# Patient Record
Sex: Female | Born: 1965 | Hispanic: Yes | Marital: Married | Smoking: Never smoker
Health system: Southern US, Community
[De-identification: ages and names within clinical notes are randomized; demographics above are authoritative.]

---

## 2007-12-11 HISTORY — PX: AUGMENTATION MAMMAPLASTY: SUR837

## 2007-12-11 HISTORY — PX: REDUCTION MAMMAPLASTY: SUR839

## 2012-04-02 ENCOUNTER — Ambulatory Visit: Payer: Self-pay | Admitting: Internal Medicine

## 2012-08-20 ENCOUNTER — Ambulatory Visit: Payer: Self-pay | Admitting: Family Medicine

## 2014-11-10 ENCOUNTER — Encounter: Payer: Self-pay | Admitting: Podiatry

## 2014-11-10 ENCOUNTER — Ambulatory Visit (INDEPENDENT_AMBULATORY_CARE_PROVIDER_SITE_OTHER): Payer: 59

## 2014-11-10 ENCOUNTER — Ambulatory Visit (INDEPENDENT_AMBULATORY_CARE_PROVIDER_SITE_OTHER): Payer: 59 | Admitting: Podiatry

## 2014-11-10 VITALS — BP 129/82 | HR 121 | Resp 16 | Ht 65.0 in | Wt 140.0 lb

## 2014-11-10 DIAGNOSIS — M722 Plantar fascial fibromatosis: Secondary | ICD-10-CM | POA: Diagnosis not present

## 2014-11-10 MED ORDER — MELOXICAM 15 MG PO TABS: 15.0000 mg | ORAL_TABLET | Freq: Every day | ORAL | Status: DC

## 2014-11-10 MED ORDER — METHYLPREDNISOLONE (PAK) 4 MG PO TABS: ORAL_TABLET | ORAL | Status: AC

## 2014-11-10 NOTE — Patient Instructions (Signed)
Plantar Fasciitis (Heel Spur Syndrome) with Rehab The plantar fascia is a fibrous, ligament-like, soft-tissue structure that spans the bottom of the foot. Plantar fasciitis is a condition that causes pain in the foot due to inflammation of the tissue. SYMPTOMS   Pain and tenderness on the underneath side of the foot.  Pain that worsens with standing or walking. CAUSES  Plantar fasciitis is caused by irritation and injury to the plantar fascia on the underneath side of the foot. Common mechanisms of injury include:  Direct trauma to bottom of the foot.  Damage to a small nerve that runs under the foot where the main fascia attaches to the heel bone.  Stress placed on the plantar fascia due to bone spurs. RISK INCREASES WITH:   Activities that place stress on the plantar fascia (running, jumping, pivoting, or cutting).  Poor strength and flexibility.  Improperly fitted shoes.  Tight calf muscles.  Flat feet.  Failure to warm-up properly before activity.  Obesity. PREVENTION  Warm up and stretch properly before activity.  Allow for adequate recovery between workouts.  Maintain physical fitness:  Strength, flexibility, and endurance.  Cardiovascular fitness.  Maintain a health body weight.  Avoid stress on the plantar fascia.  Wear properly fitted shoes, including arch supports for individuals who have flat feet. PROGNOSIS  If treated properly, then the symptoms of plantar fasciitis usually resolve without surgery. However, occasionally surgery is necessary. RELATED COMPLICATIONS   Recurrent symptoms that may result in a chronic condition.  Problems of the lower back that are caused by compensating for the injury, such as limping.  Pain or weakness of the foot during push-off following surgery.  Chronic inflammation, scarring, and partial or complete fascia tear, occurring more often from repeated injections. TREATMENT  Treatment initially involves the use of  ice and medication to help reduce pain and inflammation. The use of strengthening and stretching exercises may help reduce pain with activity, especially stretches of the Achilles tendon. These exercises may be performed at home or with a therapist. Your caregiver may recommend that you use heel cups of arch supports to help reduce stress on the plantar fascia. Occasionally, corticosteroid injections are given to reduce inflammation. If symptoms persist for greater than 6 months despite non-surgical (conservative), then surgery may be recommended.  MEDICATION   If pain medication is necessary, then nonsteroidal anti-inflammatory medications, such as aspirin and ibuprofen, or other minor pain relievers, such as acetaminophen, are often recommended.  Do not take pain medication within 7 days before surgery.  Prescription pain relievers may be given if deemed necessary by your caregiver. Use only as directed and only as much as you need.  Corticosteroid injections may be given by your caregiver. These injections should be reserved for the most serious cases, because they may only be given a certain number of times. HEAT AND COLD  Cold treatment (icing) relieves pain and reduces inflammation. Cold treatment should be applied for 10 to 15 minutes every 2 to 3 hours for inflammation and pain and immediately after any activity that aggravates your symptoms. Use ice packs or massage the area with a piece of ice (ice massage).  Heat treatment may be used prior to performing the stretching and strengthening activities prescribed by your caregiver, physical therapist, or athletic trainer. Use a heat pack or soak the injury in warm water. SEEK IMMEDIATE MEDICAL CARE IF:  Treatment seems to offer no benefit, or the condition worsens.  Any medications produce adverse side effects. EXERCISES RANGE   OF MOTION (ROM) AND STRETCHING EXERCISES - Plantar Fasciitis (Heel Spur Syndrome) These exercises may help you  when beginning to rehabilitate your injury. Your symptoms may resolve with or without further involvement from your physician, physical therapist or athletic trainer. While completing these exercises, remember:   Restoring tissue flexibility helps normal motion to return to the joints. This allows healthier, less painful movement and activity.  An effective stretch should be held for at least 30 seconds.  A stretch should never be painful. You should only feel a gentle lengthening or release in the stretched tissue. RANGE OF MOTION - Toe Extension, Flexion  Sit with your right / left leg crossed over your opposite knee.  Grasp your toes and gently pull them back toward the top of your foot. You should feel a stretch on the bottom of your toes and/or foot.  Hold this stretch for __________ seconds.  Now, gently pull your toes toward the bottom of your foot. You should feel a stretch on the top of your toes and or foot.  Hold this stretch for __________ seconds. Repeat __________ times. Complete this stretch __________ times per day.  RANGE OF MOTION - Ankle Dorsiflexion, Active Assisted  Remove shoes and sit on a chair that is preferably not on a carpeted surface.  Place right / left foot under knee. Extend your opposite leg for support.  Keeping your heel down, slide your right / left foot back toward the chair until you feel a stretch at your ankle or calf. If you do not feel a stretch, slide your bottom forward to the edge of the chair, while still keeping your heel down.  Hold this stretch for __________ seconds. Repeat __________ times. Complete this stretch __________ times per day.  STRETCH - Gastroc, Standing  Place hands on wall.  Extend right / left leg, keeping the front knee somewhat bent.  Slightly point your toes inward on your back foot.  Keeping your right / left heel on the floor and your knee straight, shift your weight toward the wall, not allowing your back to  arch.  You should feel a gentle stretch in the right / left calf. Hold this position for __________ seconds. Repeat __________ times. Complete this stretch __________ times per day. STRETCH - Soleus, Standing  Place hands on wall.  Extend right / left leg, keeping the other knee somewhat bent.  Slightly point your toes inward on your back foot.  Keep your right / left heel on the floor, bend your back knee, and slightly shift your weight over the back leg so that you feel a gentle stretch deep in your back calf.  Hold this position for __________ seconds. Repeat __________ times. Complete this stretch __________ times per day. STRETCH - Gastrocsoleus, Standing  Note: This exercise can place a lot of stress on your foot and ankle. Please complete this exercise only if specifically instructed by your caregiver.   Place the ball of your right / left foot on a step, keeping your other foot firmly on the same step.  Hold on to the wall or a rail for balance.  Slowly lift your other foot, allowing your body weight to press your heel down over the edge of the step.  You should feel a stretch in your right / left calf.  Hold this position for __________ seconds.  Repeat this exercise with a slight bend in your right / left knee. Repeat __________ times. Complete this stretch __________ times per day.    STRENGTHENING EXERCISES - Plantar Fasciitis (Heel Spur Syndrome)  These exercises may help you when beginning to rehabilitate your injury. They may resolve your symptoms with or without further involvement from your physician, physical therapist or athletic trainer. While completing these exercises, remember:   Muscles can gain both the endurance and the strength needed for everyday activities through controlled exercises.  Complete these exercises as instructed by your physician, physical therapist or athletic trainer. Progress the resistance and repetitions only as guided. STRENGTH -  Towel Curls  Sit in a chair positioned on a non-carpeted surface.  Place your foot on a towel, keeping your heel on the floor.  Pull the towel toward your heel by only curling your toes. Keep your heel on the floor.  If instructed by your physician, physical therapist or athletic trainer, add ____________________ at the end of the towel. Repeat __________ times. Complete this exercise __________ times per day. STRENGTH - Ankle Inversion  Secure one end of a rubber exercise band/tubing to a fixed object (table, pole). Loop the other end around your foot just before your toes.  Place your fists between your knees. This will focus your strengthening at your ankle.  Slowly, pull your big toe up and in, making sure the band/tubing is positioned to resist the entire motion.  Hold this position for __________ seconds.  Have your muscles resist the band/tubing as it slowly pulls your foot back to the starting position. Repeat __________ times. Complete this exercises __________ times per day.  Document Released: 11/26/2005 Document Revised: 02/18/2012 Document Reviewed: 03/10/2009 Exit[employer] Patient Information 2015 [employer]. This information is not intended to replace advice given to you by your health care provider. Make sure you discuss any questions you have with your health care provider. Plantar Fasciitis Plantar fasciitis is a common condition that causes foot pain. It is soreness (inflammation) of the band of tough fibrous tissue on the bottom of the foot that runs from the heel bone (calcaneus) to the ball of the foot. The cause of this soreness may be from excessive standing, poor fitting shoes, running on hard surfaces, being overweight, having an abnormal walk, or overuse (this is common in runners) of the painful foot or feet. It is also common in aerobic exercise dancers and ballet dancers. SYMPTOMS  Most people with plantar fasciitis complain of:  Severe pain in the morning  on the bottom of their foot especially when taking the first steps out of bed. This pain recedes after a few minutes of walking.  Severe pain is experienced also during walking following a long period of inactivity.  Pain is worse when walking barefoot or up stairs DIAGNOSIS   Your caregiver will diagnose this condition by examining and feeling your foot.  Special tests such as X-rays of your foot, are usually not needed. PREVENTION   Consult a sports medicine professional before beginning a new exercise program.  Walking programs offer a good workout. With walking there is a lower chance of overuse injuries common to runners. There is less impact and less jarring of the joints.  Begin all new exercise programs slowly. If problems or pain develop, decrease the amount of time or distance until you are at a comfortable level.  Wear good shoes and replace them regularly.  Stretch your foot and the heel cords at the back of the ankle (Achilles tendon) both before and after exercise.  Run or exercise on even surfaces that are not hard. For example, asphalt is better than   pavement.  Do not run barefoot on hard surfaces.  If using a treadmill, vary the incline.  Do not continue to workout if you have foot or joint problems. Seek professional help if they do not improve. HOME CARE INSTRUCTIONS   Avoid activities that cause you pain until you recover.  Use ice or cold packs on the problem or painful areas after working out.  Only take over-the-counter or prescription medicines for pain, discomfort, or fever as directed by your caregiver.  Soft shoe inserts or athletic shoes with air or gel sole cushions may be helpful.  If problems continue or become more severe, consult a sports medicine caregiver or your own health care provider. Cortisone is a potent anti-inflammatory medication that may be injected into the painful area. You can discuss this treatment with your caregiver. MAKE SURE  YOU:   Understand these instructions.  Will watch your condition.  Will get help right away if you are not doing well or get worse. Document Released: 08/21/2001 Document Revised: 02/18/2012 Document Reviewed: 10/20/2008 ExitCare Patient Information 2015 [employer]. This information is not intended to replace advice given to you by your health care provider. Make sure you discuss any questions you have with your health care provider.  

## 2014-11-10 NOTE — Progress Notes (Signed)
   Subjective:    Patient ID: Diana Mathis, female    DOB: 01/07/1966, 48 y.o.   MRN: [id number]  HPI Comments: i have pain in my left heel. Its been going on for 1 month. The more i walk the less it hurts. The pain is worse in the mornings and at night. i dont do anything for my heel.  Foot Pain      Review of Systems  All other systems reviewed and are negative.      Objective:   Physical Exam: I have reviewed her past medical history medications allergies surgery social history and review of systems. Pulses are strongly palpable bilateral. Neurologic sensorium is intact per Semmes-Weinstein monofilament. Degenerative flexors are intact bilateral muscle strength is 5 over 5 dorsiflexion plantar flexors and inverters and everters all intrinsic musculature is intact. Orthopedic evaluation demonstrates all joints distal to the ankle level full range of motion without crepitation. There she does have pain on palpation medial calcaneal tubercle of the left heel. Radiographic evaluation does not demonstrate any type of osseus abnormalities other than a soft tissue increase in density at the plantar fascial calcaneal insertion site. Cutaneous evaluation demonstrates supple well-hydrated cutis no erythema edema saline as drainage or odor.        Assessment & Plan:  Assessment: Plantar fasciitis left.  Plan: Discussed etiology pathology conservative versus surgical therapies. Discussed appropriate shoe gear stretching exercises ice therapy shoe gear modifications. Wrote a prescription for a Medrol Dosepak to be dispensed as well as meloxicam which will follow Medrol. She also received an injection of Kenalog and local anesthetic to her left heel. A plantar fascial brace was dispensed as well as a night splint. I will follow up with her in 1 month. Should questions arise or she had difficulty with this foot she is to notify us immediately.

## 2014-12-08 ENCOUNTER — Ambulatory Visit: Payer: PRIVATE HEALTH INSURANCE | Admitting: Podiatry

## 2014-12-08 ENCOUNTER — Ambulatory Visit (INDEPENDENT_AMBULATORY_CARE_PROVIDER_SITE_OTHER): Payer: 59 | Admitting: Podiatry

## 2014-12-08 VITALS — BP 132/95 | HR 112 | Resp 16

## 2014-12-08 DIAGNOSIS — M722 Plantar fascial fibromatosis: Secondary | ICD-10-CM

## 2014-12-08 NOTE — Progress Notes (Signed)
She presents today for follow-up of her plantar fasciitis states that she is not approximately 90% improved. She continues all conservative therapies.  Objective: Vital signs are stable she is alert and oriented 3 she has no pain on palpation medially continue tubercle of her left heel.  Assessment: Resolving plantar fasciitis left heel.  Plan: Continue all conservative therapies until 1 month point we free of pain and then discontinue all therapy. Follow up with me as needed at which time orthotics will be discussed.

## 2015-01-05 ENCOUNTER — Ambulatory Visit (INDEPENDENT_AMBULATORY_CARE_PROVIDER_SITE_OTHER): Payer: PRIVATE HEALTH INSURANCE | Admitting: Podiatry

## 2015-01-05 VITALS — BP 120/80 | HR 100 | Resp 16

## 2015-01-05 DIAGNOSIS — M722 Plantar fascial fibromatosis: Secondary | ICD-10-CM

## 2015-01-05 DIAGNOSIS — G5792 Unspecified mononeuropathy of left lower limb: Secondary | ICD-10-CM

## 2015-01-05 NOTE — Progress Notes (Signed)
She presents today for follow-up of her plantar fasciitis which she states is 85% improved however she does have some radiating pain from the dorsal aspect of her left foot. She states that the pain runs between her hallux and her second toe primarily at nighttime.  Objective: Vital signs are stable she's alert and oriented 3. There is no erythema or edema saline strength she has no pain on palpation medial tibial tubercle. The replication of pain to the hallux. She does have a dorsal tarsal exostosis the second metatarsal intermediate cuneiform joint associated with deep peroneal nerve entrapment possibly.  Assessment: Neuritis deep peroneal nerve left. Possibly associated with exostosi Also resolving plantar fasciitis.  Plan: Continue conservative therapies and loosen the the night splint dorsal strap. Continue all conservative therapies follow-up with me with recurrence

## 2015-01-28 ENCOUNTER — Ambulatory Visit: Payer: Self-pay | Admitting: Family Medicine

## 2015-06-22 ENCOUNTER — Other Ambulatory Visit: Payer: Self-pay | Admitting: Podiatry

## 2016-02-29 ENCOUNTER — Other Ambulatory Visit: Payer: Self-pay | Admitting: Family Medicine

## 2016-02-29 DIAGNOSIS — Z1231 Encounter for screening mammogram for malignant neoplasm of breast: Secondary | ICD-10-CM

## 2016-04-27 ENCOUNTER — Other Ambulatory Visit: Payer: Self-pay | Admitting: Family Medicine

## 2016-04-27 ENCOUNTER — Ambulatory Visit
Admission: RE | Admit: 2016-04-27 | Discharge: 2016-04-27 | Disposition: A | Discharge status: Home / Self Care | Payer: 59 | Source: Ambulatory Visit | Attending: Family Medicine | Admitting: Family Medicine

## 2016-04-27 DIAGNOSIS — Z1231 Encounter for screening mammogram for malignant neoplasm of breast: Secondary | ICD-10-CM | POA: Insufficient documentation

## 2017-03-14 ENCOUNTER — Other Ambulatory Visit: Payer: Self-pay | Admitting: Family Medicine

## 2017-03-14 DIAGNOSIS — Z1231 Encounter for screening mammogram for malignant neoplasm of breast: Secondary | ICD-10-CM

## 2017-05-03 ENCOUNTER — Ambulatory Visit
Admission: RE | Admit: 2017-05-03 | Discharge: 2017-05-03 | Disposition: A | Discharge status: Home / Self Care | Payer: 59 | Source: Ambulatory Visit | Attending: Family Medicine | Admitting: Family Medicine

## 2017-05-03 ENCOUNTER — Encounter: Payer: Self-pay | Admitting: Radiology

## 2017-05-03 DIAGNOSIS — Z1231 Encounter for screening mammogram for malignant neoplasm of breast: Secondary | ICD-10-CM

## 2018-05-23 ENCOUNTER — Ambulatory Visit
Admission: EM | Admit: 2018-05-23 | Discharge: 2018-05-23 | Disposition: A | Discharge status: Home / Self Care | Payer: 59 | Attending: Family Medicine | Admitting: Family Medicine

## 2018-05-23 ENCOUNTER — Encounter: Payer: Self-pay | Admitting: Gynecology

## 2018-05-23 ENCOUNTER — Other Ambulatory Visit: Payer: Self-pay

## 2018-05-23 DIAGNOSIS — B9789 Other viral agents as the cause of diseases classified elsewhere: Secondary | ICD-10-CM

## 2018-05-23 DIAGNOSIS — J069 Acute upper respiratory infection, unspecified: Secondary | ICD-10-CM | POA: Diagnosis not present

## 2018-05-23 DIAGNOSIS — R05 Cough: Secondary | ICD-10-CM | POA: Diagnosis not present

## 2018-05-23 MED ORDER — HYDROCOD POLST-CPM POLST ER 10-8 MG/5ML PO SUER: 5.0000 mL | Freq: Every evening | ORAL | 0 refills | Status: AC | PRN

## 2018-05-23 NOTE — ED Triage Notes (Signed)
Patient c/o cough x 1 week.

## 2018-05-23 NOTE — Discharge Instructions (Signed)
Flonase nasal spray Continue Allegra

## 2018-05-23 NOTE — ED Provider Notes (Signed)
[facility]    CSN: [id number] Arrival date & time: 05/23/18  1238     History   Chief Complaint Chief Complaint  Patient presents with  . Cough    HPI Diana Mathis is a 52 y.o. female.   The history is provided by the patient.  Cough  Associated symptoms: rhinorrhea   Associated symptoms: no wheezing   URI  Presenting symptoms: congestion, cough and rhinorrhea   Severity:  Moderate Onset quality:  Sudden Duration:  1 week Timing:  Constant Progression:  Unchanged Chronicity:  New Relieved by:  Nothing Ineffective treatments:  OTC medications Associated symptoms: no sinus pain, no swollen glands and no wheezing   Risk factors: not elderly, no chronic cardiac disease, no chronic kidney disease, no chronic respiratory disease, no diabetes mellitus, no immunosuppression, no recent illness, no recent travel and no sick contacts     History reviewed. No pertinent past medical history.  There are no active problems to display for this patient.   Past Surgical History:  Procedure Laterality Date  . AUGMENTATION MAMMAPLASTY Bilateral 2009  . REDUCTION MAMMAPLASTY Bilateral 2009    OB History   None      Home Medications    Prior to Admission medications   Medication Sig Start Date End Date Taking? Authorizing Provider  ibuprofen (ADVIL,MOTRIN) 200 MG tablet Take 200 mg by mouth as needed.   Yes [provider]  chlorpheniramine-HYDROcodone (TUSSIONEX PENNKINETIC ER) 10-8 MG/5ML SUER Take 5 mLs by mouth at bedtime as needed. 05/23/18   Payton Mccallum, MD  meloxicam (MOBIC) 15 MG tablet TAKE 1 TABLET BY MOUTH EVERY DAY 06/22/15   Hyatt, Max T, DPM  methylPREDNIsolone (MEDROL DOSPACK) 4 MG tablet follow package directions Patient not taking: Reported on 12/08/2014 11/10/14   Elinor Parkinson, DPM    Family History Family History  Problem Relation Age of Onset  . Breast cancer Maternal Aunt   . Breast cancer Maternal Aunt   . Hypertension  Mother     Social History Social History   Tobacco Use  . Smoking status: Never Smoker  . Smokeless tobacco: Never Used  Substance Use Topics  . Alcohol use: No    Alcohol/week: 0.0 oz  . Drug use: Never     Allergies   Patient has no known allergies.   Review of Systems Review of Systems  HENT: Positive for congestion and rhinorrhea. Negative for sinus pain.   Respiratory: Positive for cough. Negative for wheezing.      Physical Exam Triage Vital Signs ED Triage Vitals  Enc Vitals Group     BP 05/23/18 1249 (!) 162/98     Pulse Rate 05/23/18 1249 95     Resp 05/23/18 1249 16     Temp 05/23/18 1249 97.9 F (36.6 C)     Temp Source 05/23/18 1249 Oral     SpO2 05/23/18 1249 100 %     Weight 05/23/18 1247 170 lb (77.1 kg)     Height 05/23/18 1247 5\' 5"  (1.651 m)     Head Circumference --      Peak Flow --      Pain Score 05/23/18 1247 0     Pain Loc --      Pain Edu? --      Excl. in GC? --    No data found.  Updated Vital Signs BP (!) 162/98 (BP Location: Left Arm)   Pulse 95   Temp 97.9 F (36.6 C) (Oral)  Resp 16   Ht 5\' 5"  (1.651 m)   Wt 170 lb (77.1 kg)   SpO2 100%   BMI 28.29 kg/m   Visual Acuity Right Eye Distance:   Left Eye Distance:   Bilateral Distance:    Right Eye Near:   Left Eye Near:    Bilateral Near:     Physical Exam  Constitutional: She appears well-developed and well-nourished. No distress.  HENT:  Head: Normocephalic and atraumatic.  Right Ear: Tympanic membrane, external ear and ear canal normal.  Left Ear: Tympanic membrane, external ear and ear canal normal.  Nose: Mucosal edema and rhinorrhea present. No nose lacerations, sinus tenderness, nasal deformity, septal deviation or nasal septal hematoma. No epistaxis.  No foreign bodies. Right sinus exhibits no maxillary sinus tenderness and no frontal sinus tenderness. Left sinus exhibits no maxillary sinus tenderness and no frontal sinus tenderness.  Mouth/Throat: Uvula  is midline, oropharynx is clear and moist and mucous membranes are normal. No oropharyngeal exudate, posterior oropharyngeal edema, posterior oropharyngeal erythema or tonsillar abscesses. No tonsillar exudate.  Eyes: Pupils are equal, round, and reactive to light. Conjunctivae and EOM are normal. Right eye exhibits no discharge. Left eye exhibits no discharge. No scleral icterus.  Neck: Normal range of motion. Neck supple. No thyromegaly present.  Cardiovascular: Normal rate, regular rhythm and normal heart sounds.  Pulmonary/Chest: Effort normal and breath sounds normal. No stridor. No respiratory distress. She has no wheezes. She has no rales.  Lymphadenopathy:    She has no cervical adenopathy.  Neurological: She is alert.  Skin: She is not diaphoretic.  Nursing note and vitals reviewed.    UC Treatments / Results  Labs (all labs ordered are listed, but only abnormal results are displayed) Labs Reviewed - No data to display  EKG None  Radiology No results found.  Procedures Procedures (including critical care time)  Medications Ordered in UC Medications - No data to display  Initial Impression / Assessment and Plan / UC Course  I have reviewed the triage vital signs and the nursing notes.  Pertinent labs & imaging results that were available during my care of the patient were reviewed by me and considered in my medical decision making (see chart for details).      Final Clinical Impressions(s) / UC Diagnoses   Final diagnoses:  Viral URI with cough     Discharge Instructions     Flonase nasal spray Continue Allegra    ED Prescriptions    Medication Sig Dispense Auth. Provider   chlorpheniramine-HYDROcodone (TUSSIONEX PENNKINETIC ER) 10-8 MG/5ML SUER Take 5 mLs by mouth at bedtime as needed. 60 mL Payton Mccallumonty, Taheera Thomann, MD     1. Lab results and diagnosis reviewed with patient  2. rx as per orders above; reviewed possible side effects, interactions, risks and  benefits  3. Recommend supportive treatment as above 4. Follow-up prn if symptoms worsen or don't improve   Controlled Substance Prescriptions [place] Controlled Substance Registry consulted? Not Applicable   Payton Mccallumonty, Jaquayla Hege, MD 05/23/18 1357

## 2018-10-22 ENCOUNTER — Other Ambulatory Visit: Payer: Self-pay | Admitting: Family Medicine

## 2018-10-22 DIAGNOSIS — Z1231 Encounter for screening mammogram for malignant neoplasm of breast: Secondary | ICD-10-CM

## 2018-11-21 ENCOUNTER — Ambulatory Visit
Admission: RE | Admit: 2018-11-21 | Discharge: 2018-11-21 | Disposition: A | Discharge status: Home / Self Care | Payer: 59 | Source: Ambulatory Visit | Attending: Family Medicine | Admitting: Family Medicine

## 2018-11-21 DIAGNOSIS — Z1231 Encounter for screening mammogram for malignant neoplasm of breast: Secondary | ICD-10-CM | POA: Insufficient documentation

## 2019-11-23 ENCOUNTER — Other Ambulatory Visit: Payer: Self-pay | Admitting: Family Medicine

## 2019-11-23 DIAGNOSIS — Z1231 Encounter for screening mammogram for malignant neoplasm of breast: Secondary | ICD-10-CM

## 2019-11-27 ENCOUNTER — Ambulatory Visit
Admission: RE | Admit: 2019-11-27 | Discharge: 2019-11-27 | Disposition: A | Discharge status: Home / Self Care | Payer: 59 | Source: Ambulatory Visit | Attending: Family Medicine | Admitting: Family Medicine

## 2019-11-27 ENCOUNTER — Other Ambulatory Visit: Payer: Self-pay

## 2019-11-27 DIAGNOSIS — Z1231 Encounter for screening mammogram for malignant neoplasm of breast: Secondary | ICD-10-CM | POA: Diagnosis not present

## 2019-12-05 IMAGING — MG DIGITAL SCREENING BILATERAL MAMMOGRAM WITH IMPLANTS, CAD AND TOM
8 of 19 series · 8 of 40 positions shown · non-contrast
Comparison: Previous exam(s).

CLINICAL DATA: Screening.

EXAM:
DIGITAL SCREENING BILATERAL MAMMOGRAM WITH IMPLANTS, CAD AND TOMO
The patient has retropectoral implants. Standard and implant
displaced views were performed.

[R MLO]
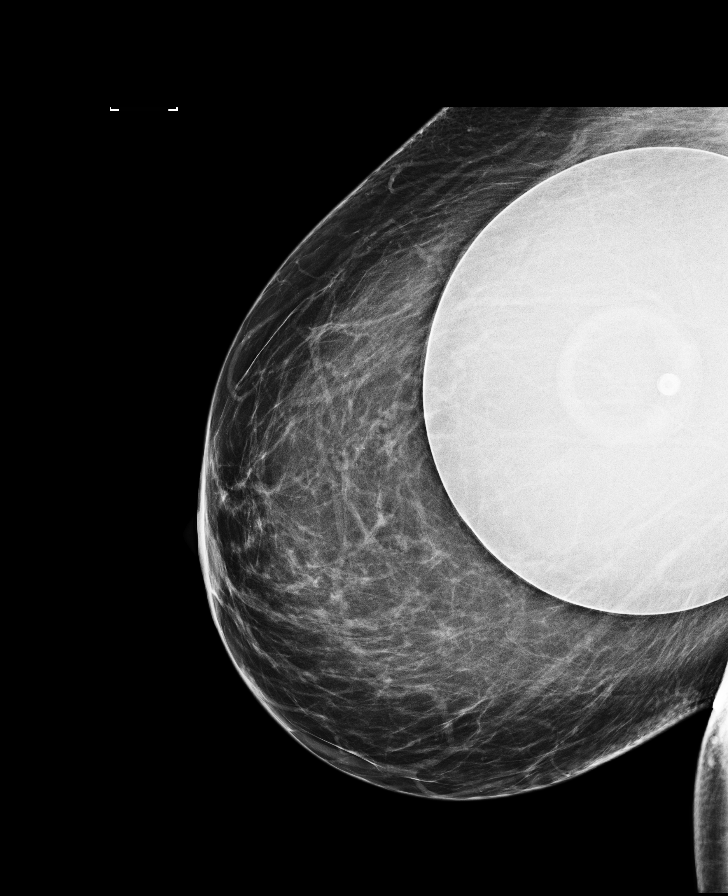

[L CC (1 of 2)]
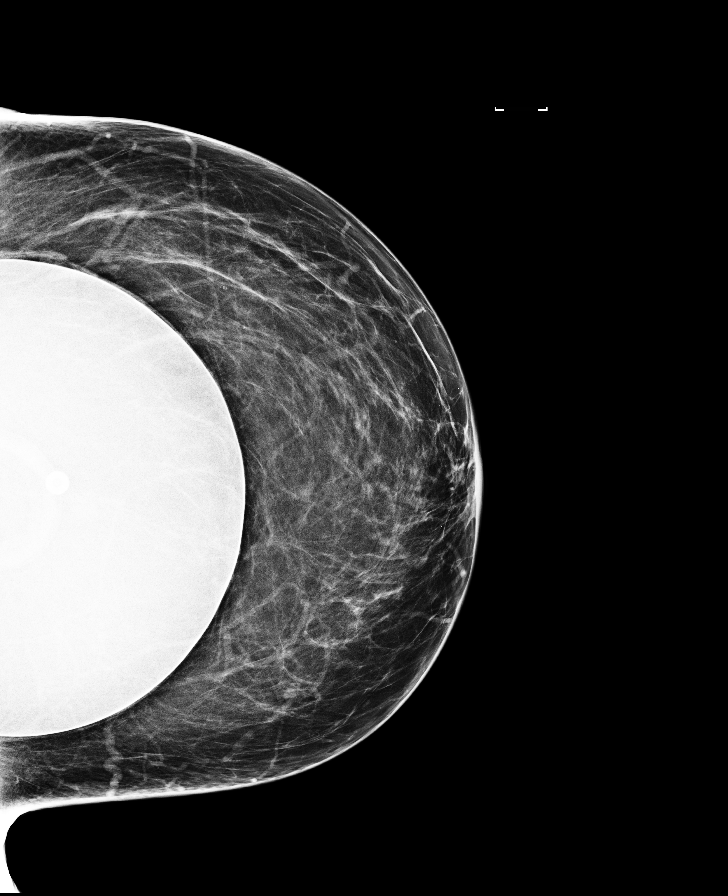

[R CC (1 of 2)]
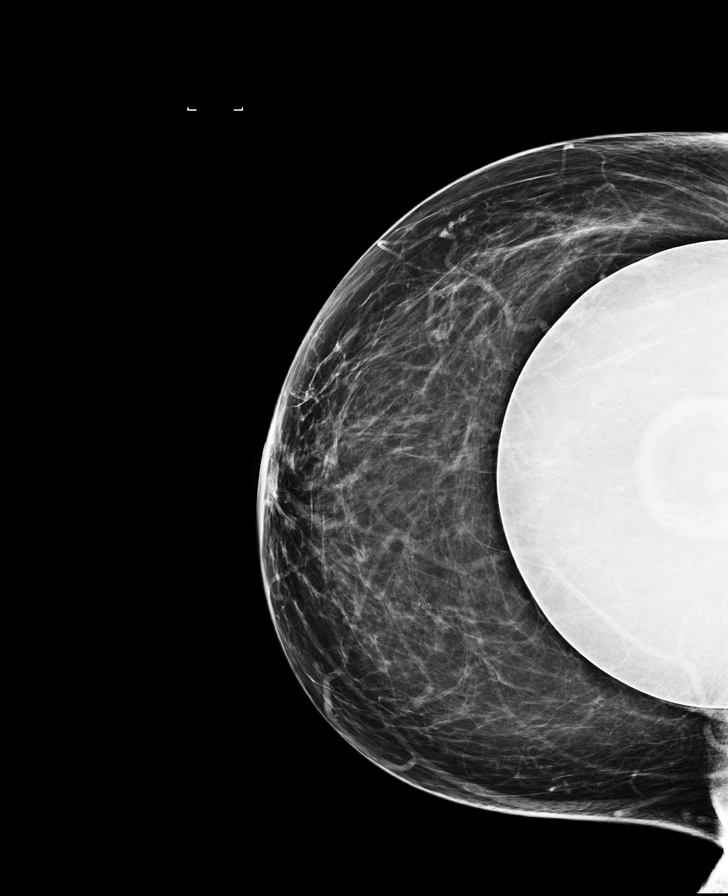

[L MLO]
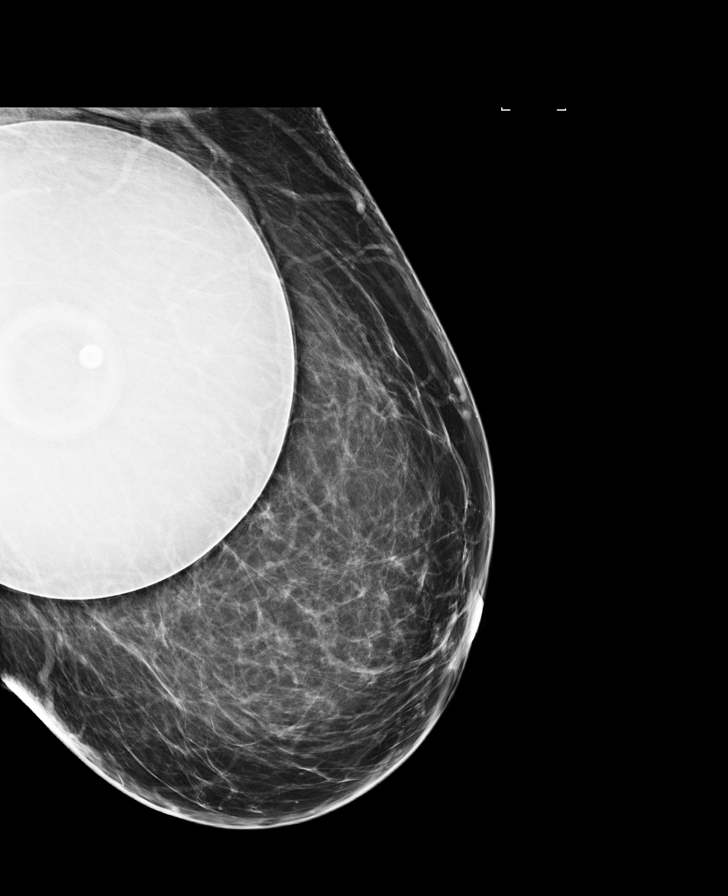

[R MLO synth-2D]
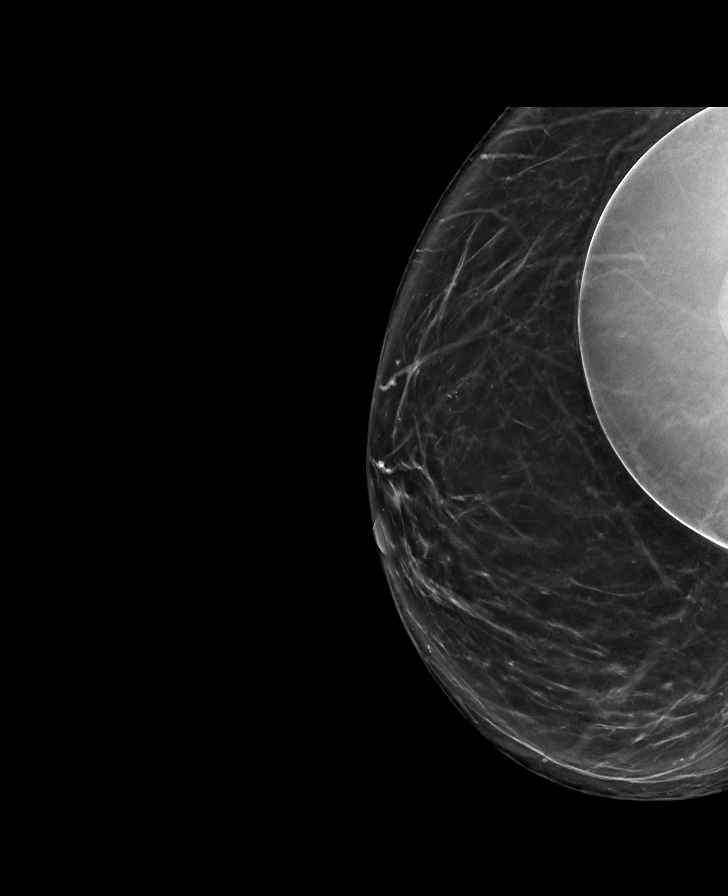

[L CC (2 of 2)]
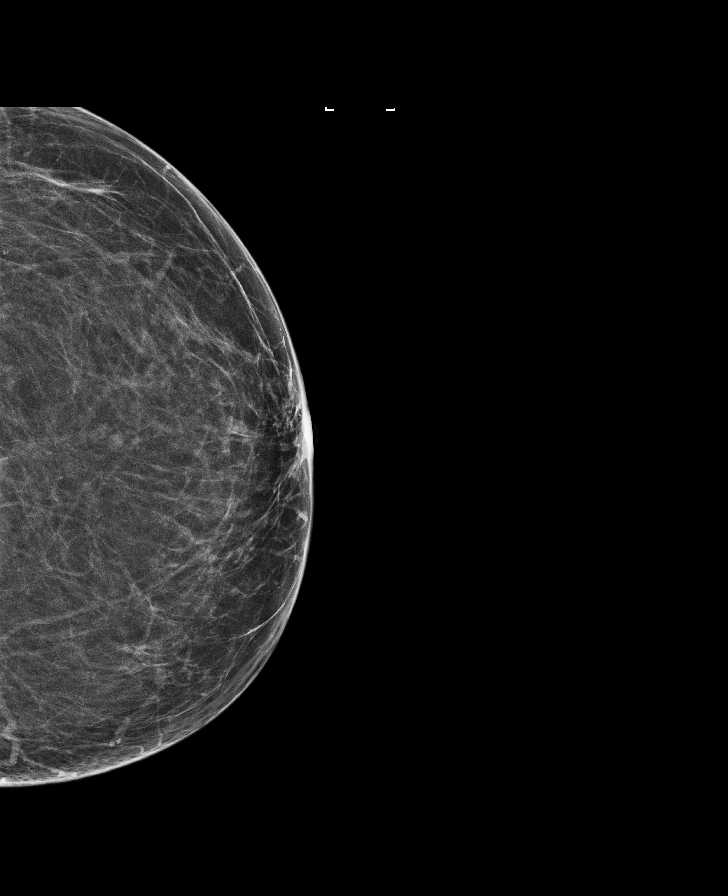

[R CC (2 of 2)]
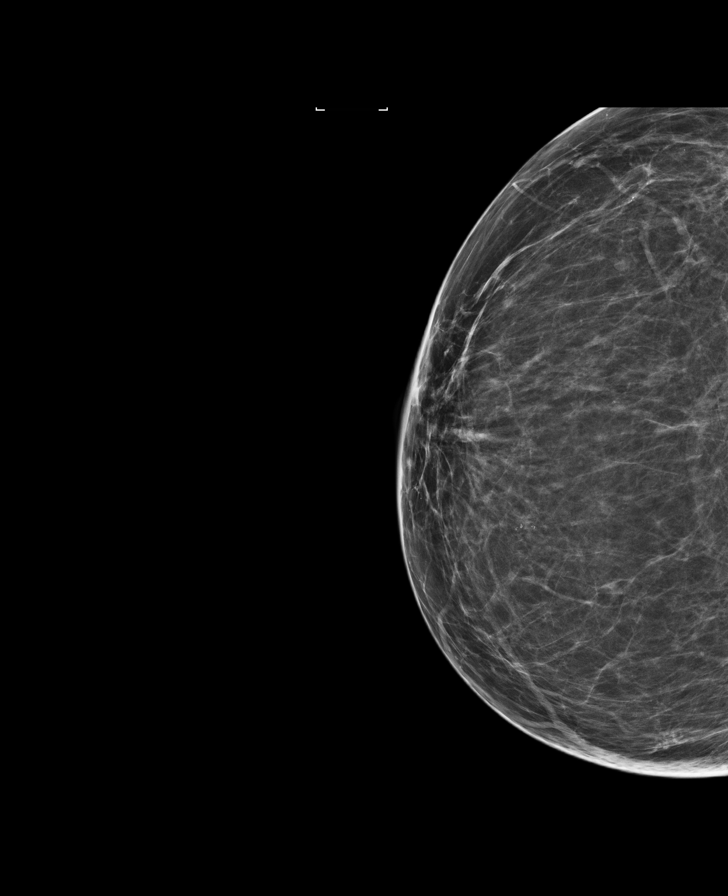

[L MLO synth-2D]
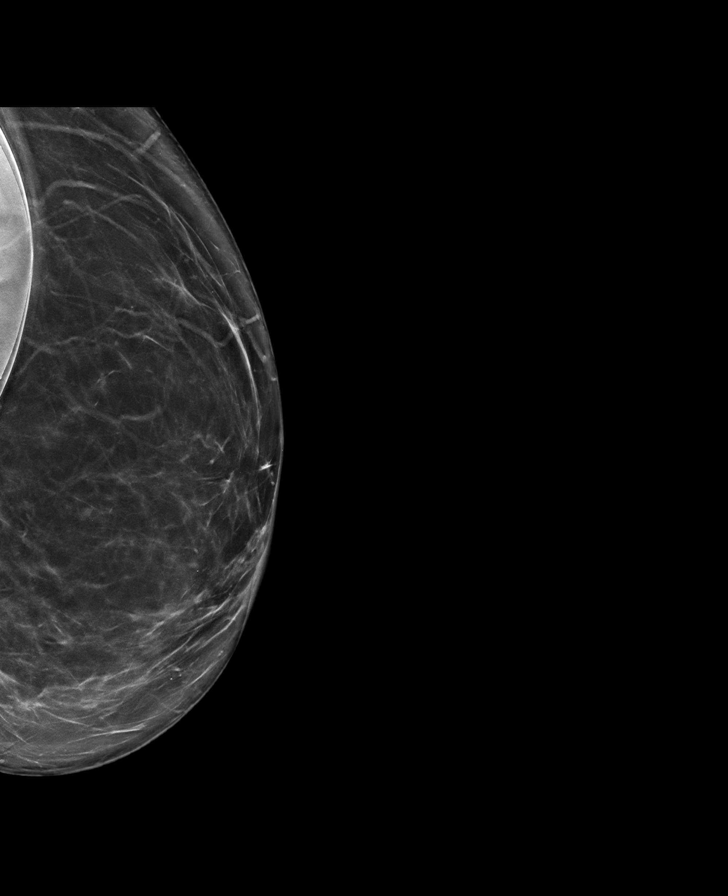

[8 of 40 positions shown; findings below may reference images not displayed]

ACR Breast Density Category b: There are scattered areas of
fibroglandular density.
FINDINGS: There are no findings suspicious for malignancy. Images were
processed with CAD.
IMPRESSION: No mammographic evidence of malignancy. A result letter of this
screening mammogram will be mailed directly to the patient.

RECOMMENDATION:
Screening mammogram in one year. (Code:60-T-8Z4)

BI-RADS CATEGORY  1:  Negative.

## 2020-11-02 ENCOUNTER — Other Ambulatory Visit: Payer: Self-pay | Admitting: Family Medicine

## 2020-11-02 DIAGNOSIS — Z1231 Encounter for screening mammogram for malignant neoplasm of breast: Secondary | ICD-10-CM

## 2020-11-30 ENCOUNTER — Ambulatory Visit
Admission: RE | Admit: 2020-11-30 | Discharge: 2020-11-30 | Disposition: A | Discharge status: Home / Self Care | Payer: 59 | Source: Ambulatory Visit | Attending: Family Medicine | Admitting: Family Medicine

## 2020-11-30 ENCOUNTER — Other Ambulatory Visit: Payer: Self-pay

## 2020-11-30 DIAGNOSIS — Z1231 Encounter for screening mammogram for malignant neoplasm of breast: Secondary | ICD-10-CM | POA: Insufficient documentation

## 2021-11-22 ENCOUNTER — Other Ambulatory Visit: Payer: Self-pay | Admitting: Family Medicine

## 2021-11-22 DIAGNOSIS — Z1231 Encounter for screening mammogram for malignant neoplasm of breast: Secondary | ICD-10-CM

## 2021-12-01 ENCOUNTER — Ambulatory Visit
Admission: RE | Admit: 2021-12-01 | Discharge: 2021-12-01 | Disposition: A | Discharge status: Home / Self Care | Payer: 59 | Source: Ambulatory Visit | Attending: Family Medicine | Admitting: Family Medicine

## 2021-12-01 ENCOUNTER — Other Ambulatory Visit: Payer: Self-pay

## 2021-12-01 DIAGNOSIS — Z1231 Encounter for screening mammogram for malignant neoplasm of breast: Secondary | ICD-10-CM | POA: Insufficient documentation

## 2021-12-07 ENCOUNTER — Other Ambulatory Visit: Payer: Self-pay | Admitting: Family Medicine

## 2021-12-07 DIAGNOSIS — R928 Other abnormal and inconclusive findings on diagnostic imaging of breast: Secondary | ICD-10-CM

## 2021-12-07 DIAGNOSIS — R921 Mammographic calcification found on diagnostic imaging of breast: Secondary | ICD-10-CM

## 2021-12-11 ENCOUNTER — Ambulatory Visit
Admission: EM | Admit: 2021-12-11 | Discharge: 2021-12-11 | Disposition: A | Discharge status: Home / Self Care | Payer: 59

## 2021-12-11 ENCOUNTER — Encounter: Payer: Self-pay | Admitting: Emergency Medicine

## 2021-12-11 ENCOUNTER — Other Ambulatory Visit: Payer: Self-pay

## 2021-12-11 DIAGNOSIS — H1033 Unspecified acute conjunctivitis, bilateral: Secondary | ICD-10-CM

## 2021-12-11 MED ORDER — MOXIFLOXACIN HCL 0.5 % OP SOLN: 1.0000 [drp] | Freq: Three times a day (TID) | OPHTHALMIC | 0 refills | Status: DC

## 2021-12-11 MED ORDER — MOXIFLOXACIN HCL 0.5 % OP SOLN: 1.0000 [drp] | Freq: Three times a day (TID) | OPHTHALMIC | 0 refills | Status: AC

## 2021-12-11 NOTE — ED Triage Notes (Signed)
PT reports bilateral eye redness with yellow drainage that started yesterday. Denies pain.

## 2021-12-11 NOTE — ED Provider Notes (Signed)
[facility]    CSN: [id number] Arrival date & time: 12/11/21  0941      History   Chief Complaint Chief Complaint  Patient presents with   Conjunctivitis    HPI Vonita Delaet is a 56 y.o. female presenting with her mother for bilateral eye redness and yellowish drainage and crusting x 2 days.  Denies pain.  Patient reportedly exposed to [place]keye through her daughter last week. Her daughter's symptoms resolved with antibiotic eyedrops.  Patient denies any other symptoms.  No fever, fatigue, cough, congestion or sore throat.  Has used Visine redness relief eyedrops without improvement in symptoms.  Patient does wear contact lenses but has not been wearing them since yesterday.  HPI  History reviewed. No pertinent past medical history.  There are no problems to display for this patient.   Past Surgical History:  Procedure Laterality Date   AUGMENTATION MAMMAPLASTY Bilateral 2009   REDUCTION MAMMAPLASTY Bilateral 2009    OB History   No obstetric history on file.      Home Medications    Prior to Admission medications   Medication Sig Start Date End Date Taking? Authorizing Provider  amLODipine (NORVASC) 10 MG tablet Take 10 mg by mouth daily.   Yes [provider]  chlorpheniramine-HYDROcodone (TUSSIONEX PENNKINETIC ER) 10-8 MG/5ML SUER Take 5 mLs by mouth at bedtime as needed. 05/23/18   Norval Gable, MD  ibuprofen (ADVIL,MOTRIN) 200 MG tablet Take 200 mg by mouth as needed.    [provider]  meloxicam (MOBIC) 15 MG tablet TAKE 1 TABLET BY MOUTH EVERY DAY 06/22/15   Hyatt, Max T, DPM  methylPREDNIsolone (MEDROL DOSPACK) 4 MG tablet follow package directions Patient not taking: Reported on 12/08/2014 11/10/14   Hyatt, Max T, DPM  moxifloxacin (VIGAMOX) 0.5 % ophthalmic solution Place 1 drop into both eyes 3 (three) times daily for 7 days. 12/11/21 12/18/21  Danton Clap, PA-C    Family History Family History  Problem Relation Age of Onset    Breast cancer Maternal Aunt    Breast cancer Maternal Aunt    Hypertension Mother     Social History Social History   Tobacco Use   Smoking status: Never   Smokeless tobacco: Never  Substance Use Topics   Alcohol use: No    Alcohol/week: 0.0 standard drinks   Drug use: Never     Allergies   Patient has no known allergies.   Review of Systems Review of Systems  Constitutional:  Negative for chills, diaphoresis, fatigue and fever.  HENT:  Negative for congestion, facial swelling, rhinorrhea and sore throat.   Eyes:  Positive for discharge and redness. Negative for photophobia, pain, itching and visual disturbance.  Respiratory:  Negative for cough.   Gastrointestinal:  Negative for nausea and vomiting.  Musculoskeletal:  Negative for myalgias.  Skin:  Negative for rash.  Neurological:  Negative for weakness and headaches.    Physical Exam Triage Vital Signs ED Triage Vitals  Enc Vitals Group     BP      Pulse      Resp      Temp      Temp src      SpO2      Weight      Height      Head Circumference      Peak Flow      Pain Score      Pain Loc      Pain Edu?  Excl. in [place]?    No data found.  Updated Vital Signs BP (!) 157/82    Pulse 98    Temp 98.6 F (37 C) (Oral)    Resp 16    SpO2 99%      Physical Exam Vitals and nursing note reviewed.  Constitutional:      General: She is not in acute distress.    Appearance: Normal appearance. She is not ill-appearing or toxic-appearing.  HENT:     Head: Normocephalic and atraumatic.     Nose: Nose normal.     Mouth/Throat:     Mouth: Mucous membranes are moist.     Pharynx: Oropharynx is clear.  Eyes:     General: No scleral icterus.       Right eye: Discharge present.        Left eye: Discharge present.    Conjunctiva/sclera:     Right eye: Right conjunctiva is injected.     Left eye: Left conjunctiva is injected.     Pupils: Pupils are equal, round, and reactive to light.     Comments:  Scant yellow crusting of eyelashes bilaterally  Cardiovascular:     Rate and Rhythm: Normal rate and regular rhythm.  Pulmonary:     Effort: Pulmonary effort is normal. No respiratory distress.  Musculoskeletal:     Cervical back: Neck supple.  Skin:    General: Skin is dry.  Neurological:     General: No focal deficit present.     Mental Status: She is alert. Mental status is at baseline.     Motor: No weakness.     Gait: Gait normal.  Psychiatric:        Mood and Affect: Mood normal.        Behavior: Behavior normal.        Thought Content: Thought content normal.     UC Treatments / Results  Labs (all labs ordered are listed, but only abnormal results are displayed) Labs Reviewed - No data to display  EKG   Radiology No results found.  Procedures Procedures (including critical care time)  Medications Ordered in UC Medications - No data to display  Initial Impression / Assessment and Plan / UC Course  I have reviewed the triage vital signs and the nursing notes.  Pertinent labs & imaging results that were available during my care of the patient were reviewed by me and considered in my medical decision making (see chart for details).   56 year old female presenting bilateral eye redness and drainage over the past week.  Exposed to [place].  Clinical presentation today is consistent with suspected bacterial conjunctivitis.  Treating with Vigamox eyedrops.  Reviewed that this is very contagious and to practice good hygiene reviewed following up as needed if not getting better over the next week or if worsening symptoms.  Final Clinical Impressions(s) / UC Diagnoses   Final diagnoses:  Acute bacterial conjunctivitis of both eyes   Discharge Instructions   None    ED Prescriptions     Medication Sig Dispense Auth. Provider   moxifloxacin (VIGAMOX) 0.5 % ophthalmic solution  (Status: Discontinued) Place 1 drop into both eyes 3 (three) times daily for 7 days. 3 mL  Laurene Footman B, PA-C   moxifloxacin (VIGAMOX) 0.5 % ophthalmic solution Place 1 drop into both eyes 3 (three) times daily for 7 days. 3 mL Danton Clap, PA-C      PDMP not reviewed this encounter.   Danton Clap, PA-C  12/11/21 1117 ° °

## 2021-12-18 ENCOUNTER — Other Ambulatory Visit: Payer: Self-pay

## 2021-12-18 ENCOUNTER — Ambulatory Visit
Admission: RE | Admit: 2021-12-18 | Discharge: 2021-12-18 | Disposition: A | Discharge status: Home / Self Care | Payer: 59 | Source: Ambulatory Visit | Attending: Family Medicine | Admitting: Family Medicine

## 2021-12-18 DIAGNOSIS — R928 Other abnormal and inconclusive findings on diagnostic imaging of breast: Secondary | ICD-10-CM | POA: Insufficient documentation

## 2021-12-18 DIAGNOSIS — R921 Mammographic calcification found on diagnostic imaging of breast: Secondary | ICD-10-CM | POA: Diagnosis present

## 2021-12-20 ENCOUNTER — Other Ambulatory Visit: Payer: Self-pay | Admitting: Family Medicine

## 2021-12-20 DIAGNOSIS — R921 Mammographic calcification found on diagnostic imaging of breast: Secondary | ICD-10-CM

## 2022-02-07 DEATH — deceased

## 2022-05-18 ENCOUNTER — Ambulatory Visit
Admission: RE | Admit: 2022-05-18 | Discharge: 2022-05-18 | Disposition: A | Discharge status: Home / Self Care | Payer: 59 | Source: Ambulatory Visit | Attending: Family Medicine | Admitting: Family Medicine

## 2022-05-18 DIAGNOSIS — R921 Mammographic calcification found on diagnostic imaging of breast: Secondary | ICD-10-CM | POA: Insufficient documentation

## 2022-10-09 ENCOUNTER — Other Ambulatory Visit: Payer: Self-pay | Admitting: Family Medicine

## 2022-10-09 DIAGNOSIS — R928 Other abnormal and inconclusive findings on diagnostic imaging of breast: Secondary | ICD-10-CM

## 2022-10-09 DIAGNOSIS — R921 Mammographic calcification found on diagnostic imaging of breast: Secondary | ICD-10-CM

## 2022-12-07 ENCOUNTER — Ambulatory Visit
Admission: RE | Admit: 2022-12-07 | Discharge: 2022-12-07 | Disposition: A | Discharge status: Home / Self Care | Payer: 59 | Source: Ambulatory Visit | Attending: Family Medicine | Admitting: Family Medicine

## 2022-12-07 DIAGNOSIS — R928 Other abnormal and inconclusive findings on diagnostic imaging of breast: Secondary | ICD-10-CM | POA: Diagnosis present

## 2022-12-07 DIAGNOSIS — R921 Mammographic calcification found on diagnostic imaging of breast: Secondary | ICD-10-CM | POA: Insufficient documentation

## 2023-11-22 ENCOUNTER — Other Ambulatory Visit: Payer: Self-pay | Admitting: Family Medicine

## 2023-11-22 DIAGNOSIS — R921 Mammographic calcification found on diagnostic imaging of breast: Secondary | ICD-10-CM

## 2023-12-13 ENCOUNTER — Ambulatory Visit
Admission: RE | Admit: 2023-12-13 | Discharge: 2023-12-13 | Disposition: A | Discharge status: Home / Self Care | Payer: PRIVATE HEALTH INSURANCE | Source: Ambulatory Visit | Attending: Family Medicine | Admitting: Family Medicine

## 2023-12-13 DIAGNOSIS — R921 Mammographic calcification found on diagnostic imaging of breast: Secondary | ICD-10-CM

## 2025-01-06 ENCOUNTER — Other Ambulatory Visit: Payer: Self-pay | Admitting: Family Medicine

## 2025-01-06 DIAGNOSIS — Z1231 Encounter for screening mammogram for malignant neoplasm of breast: Secondary | ICD-10-CM
